# Patient Record
Sex: Female | Born: 1965 | Race: Black or African American | Hispanic: No | Marital: Single | Smoking: Current every day smoker
Health system: Southern US, Community
[De-identification: ages and names within clinical notes are randomized; demographics above are authoritative.]

## PROBLEM LIST (undated history)

## (undated) DIAGNOSIS — I10 Essential (primary) hypertension: Secondary | ICD-10-CM

## (undated) DIAGNOSIS — E7800 Pure hypercholesterolemia, unspecified: Secondary | ICD-10-CM

---

## 2016-08-13 ENCOUNTER — Encounter: Payer: Self-pay | Admitting: *Deleted

## 2016-08-13 ENCOUNTER — Ambulatory Visit: Payer: Self-pay | Admitting: Internal Medicine

## 2017-01-28 ENCOUNTER — Emergency Department (HOSPITAL_BASED_OUTPATIENT_CLINIC_OR_DEPARTMENT_OTHER)
Admission: EM | Admit: 2017-01-28 | Discharge: 2017-01-28 | Disposition: A | Discharge status: Home / Self Care | Payer: Medicare Other | Attending: Emergency Medicine | Admitting: Emergency Medicine

## 2017-01-28 ENCOUNTER — Encounter (HOSPITAL_BASED_OUTPATIENT_CLINIC_OR_DEPARTMENT_OTHER): Payer: Self-pay | Admitting: *Deleted

## 2017-01-28 ENCOUNTER — Emergency Department (HOSPITAL_BASED_OUTPATIENT_CLINIC_OR_DEPARTMENT_OTHER): Payer: Medicare Other

## 2017-01-28 DIAGNOSIS — Z79899 Other long term (current) drug therapy: Secondary | ICD-10-CM | POA: Insufficient documentation

## 2017-01-28 DIAGNOSIS — M79644 Pain in right finger(s): Secondary | ICD-10-CM | POA: Diagnosis not present

## 2017-01-28 DIAGNOSIS — I10 Essential (primary) hypertension: Secondary | ICD-10-CM | POA: Insufficient documentation

## 2017-01-28 DIAGNOSIS — Z7982 Long term (current) use of aspirin: Secondary | ICD-10-CM | POA: Diagnosis not present

## 2017-01-28 DIAGNOSIS — F17210 Nicotine dependence, cigarettes, uncomplicated: Secondary | ICD-10-CM | POA: Insufficient documentation

## 2017-01-28 HISTORY — DX: Essential (primary) hypertension: I10

## 2017-01-28 HISTORY — DX: Pure hypercholesterolemia, unspecified: E78.00

## 2017-01-28 MED ORDER — IBUPROFEN 400 MG PO TABS: 400.0000 mg | ORAL_TABLET | Freq: Four times a day (QID) | ORAL | 0 refills | Status: AC | PRN

## 2017-01-28 MED FILL — IBUPROFEN 400 MG TABLET: 400 | 8 days supply | Qty: 30 | Fill #0

## 2017-01-28 NOTE — ED Notes (Signed)
ED Provider at bedside. 

## 2017-01-28 NOTE — ED Triage Notes (Signed)
Pain, redness, swelling to her right 2nd digit x 4 days.

## 2017-01-28 NOTE — Discharge Instructions (Signed)
Take the medications as needed for pain. Follow-up with an orthopedic doctor if her symptoms are not improving over the next couple of weeks. Use the splint for comfort

## 2017-01-28 NOTE — ED Provider Notes (Signed)
[facility]   CSN: [id number] Arrival date & time: 01/28/17  1142     History   Chief Complaint Chief Complaint  Patient presents with  . Hand Pain    HPI Carrie Hale is a 51 y.o. female.  HPI Patient presents to the emergency room with complaints of right index finger pain. Patient has noticed some pain and swelling at the PIP joint on Her right finger. Patient states whenever she knocks it's always with that finger. She is noticed increased swelling and tenderness recently. She denies any fevers. No known injuries. Past Medical History:  Diagnosis Date  . High cholesterol   . Hypertension     There are no active problems to display for this patient.   History reviewed. No pertinent surgical history.  OB History    No data available       Home Medications    Prior to Admission medications   Medication Sig Start Date End Date Taking? Authorizing Provider  aspirin 81 MG tablet Take 81 mg by mouth daily.   Yes [provider]  ATENOLOL PO Take by mouth.   Yes [provider]  LISINOPRIL PO Take by mouth.   Yes [provider]  SIMVASTATIN PO Take by mouth.   Yes [provider]  ibuprofen (ADVIL,MOTRIN) 400 MG tablet Take 1 tablet (400 mg total) by mouth every 6 (six) hours as needed. 01/28/17   Linwood DibblesKnapp, Denym Rahimi, MD    Family History No family history on file.  Social History Social History  Substance Use Topics  . Smoking status: Current Every Day Smoker  . Smokeless tobacco: Never Used  . Alcohol use Yes     Allergies   Patient has no known allergies.   Review of Systems Review of Systems  All other systems reviewed and are negative.    Physical Exam Updated Vital Signs BP 134/78   Pulse 63   Temp 99 F (37.2 C) (Oral)   Resp 20   Ht 1.549 m (5\' 1" )   Wt 59.9 kg (132 lb)   SpO2 97%   BMI 24.94 kg/m   Physical Exam  Constitutional: She appears well-developed and well-nourished.  No distress.  HENT:  Head: Normocephalic and atraumatic.  Right Ear: External ear normal.  Left Ear: External ear normal.  Eyes: Conjunctivae are normal. Right eye exhibits no discharge. Left eye exhibits no discharge. No scleral icterus.  Neck: Neck supple. No tracheal deviation present.  Cardiovascular: Normal rate.   Pulmonary/Chest: Effort normal. No stridor. No respiratory distress.  Abdominal: She exhibits no distension.  Musculoskeletal: She exhibits no edema.       Right hand: She exhibits tenderness and bony tenderness. She exhibits no deformity.       Hands: ttp pip joint right index finger, increased callus formation, mild erythema, no fluctuance, no pustule , no bony tenderness, distal capillary refill is normal, no erythema or edema of the finger  Neurological: She is alert. Cranial nerve deficit: no gross deficits.  Skin: Skin is warm and dry. No rash noted.  Psychiatric: She has a normal mood and affect.  Nursing note and vitals reviewed.    ED Treatments / Results  Labs (all labs ordered are listed, but only abnormal results are displayed) Labs Reviewed - No data to display   Radiology Dg Finger Index Right  Result Date: 01/28/2017 CLINICAL DATA:  Right index finger is swollen from knocking on doors. EXAM: RIGHT INDEX FINGER 2+V COMPARISON:  None. FINDINGS: Tiny ossicle adjacent to the medial aspect of the PIP joint of the index finger may resent a tiny accessory ossicle versus sequela of age-indeterminate avulsive injury. No additional fractures identified. Minimal soft tissue swelling about the PIP joint of the index finger. No radiopaque foreign body. Joint spaces are preserved. No erosions. IMPRESSION: Tiny ossicle adjacent to the PIP joint of the index finger may represent an accessory ossicle versus the sequela of age-indeterminate avulsive injury. Correlation for point tenderness at this location is recommended. Electronically Signed   By: Simonne Come M.D.   On:  01/28/2017 13:45    Procedures Procedures (including critical care time)  Medications Ordered in ED Medications - No data to display   Initial Impression / Assessment and Plan / ED Course  I have reviewed the triage vital signs and the nursing notes.  Pertinent labs & imaging results that were available during my care of the patient were reviewed by me and considered in my medical decision making (see chart for details).   the patient's x-rays show tiny ossicles adjacent to the PIP joint. This possible she's had a small avulsion injury. Does have focal tenderness in that area. Patient will be placed in a foam finger splint for comfort. Follow-up with orthopedic hand surgery for symptoms do not resolve  Final Clinical Impressions(s) / ED Diagnoses   Final diagnoses:  Pain of finger of right hand    New Prescriptions New Prescriptions   IBUPROFEN (ADVIL,MOTRIN) 400 MG TABLET    Take 1 tablet (400 mg total) by mouth every 6 (six) hours as needed.     Linwood Dibbles, MD 01/28/17 [phone]

## 2018-04-26 IMAGING — DX DG FINGER INDEX 2+V*R*
3 series · 3 of 3 positions shown · non-contrast
Comparison: None.

CLINICAL DATA: Right index finger is swollen from knocking on
doors.

EXAM:
RIGHT INDEX FINGER 2+V

[finger ap]
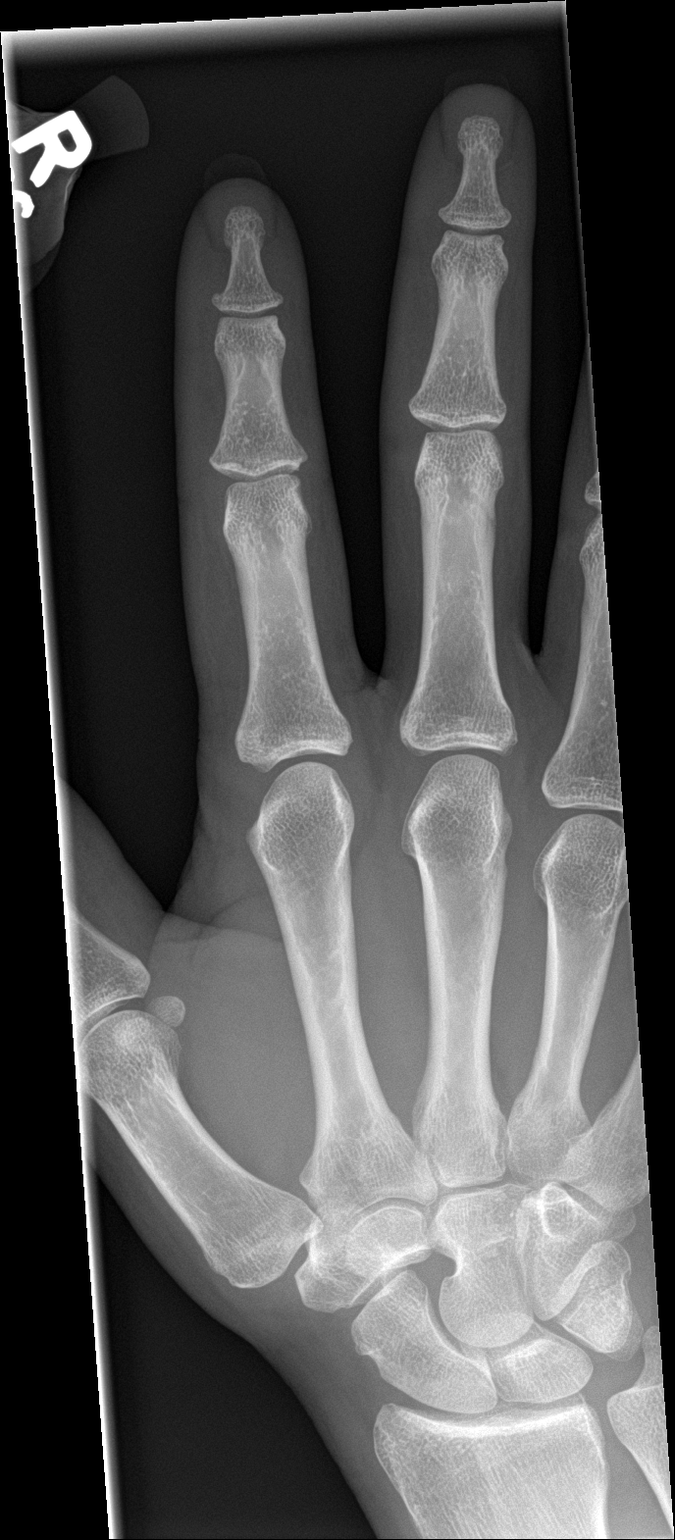

[finger obl]
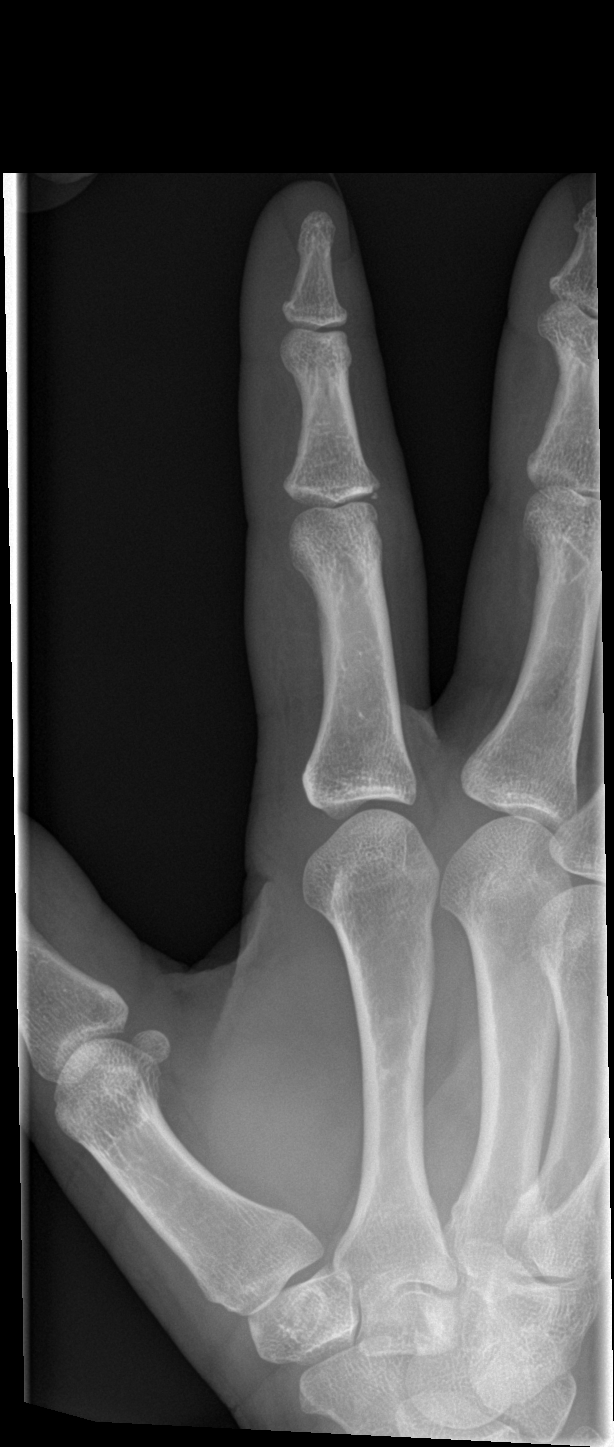

[finger lat]
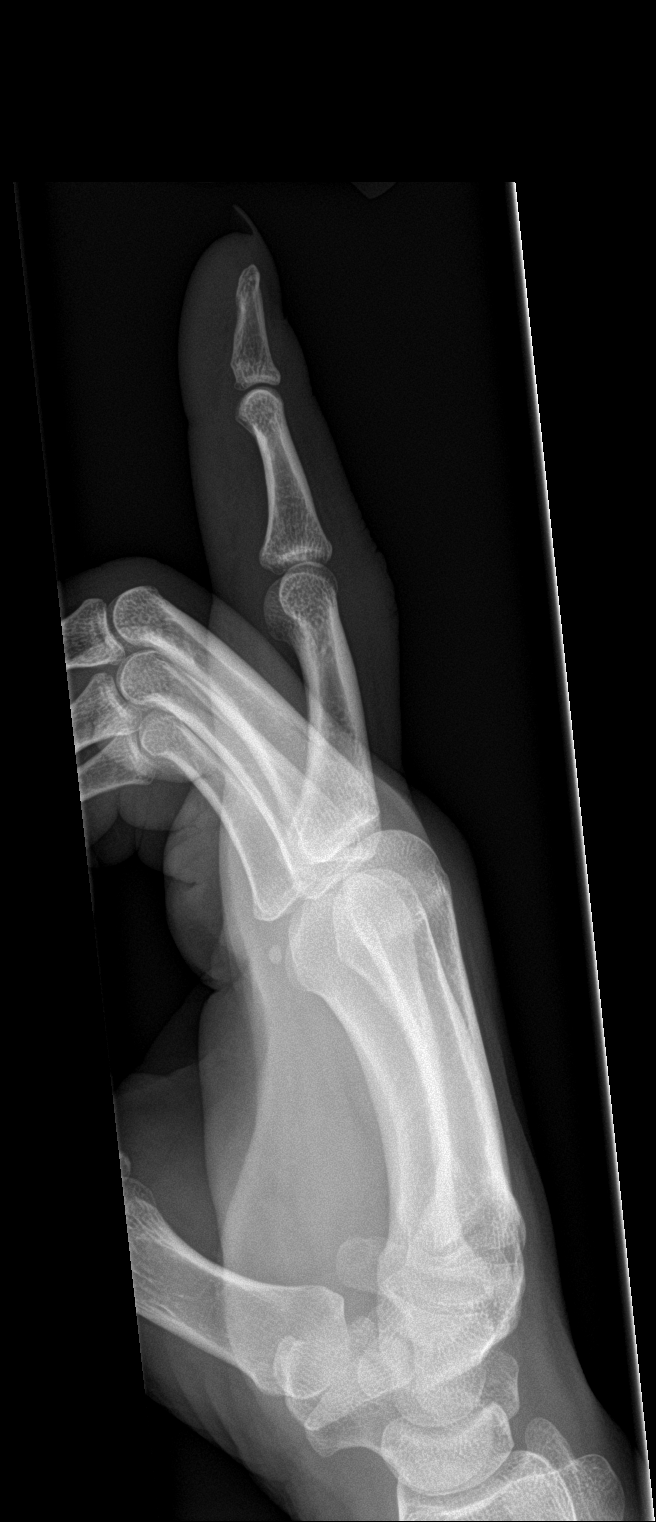

[3 of 3 positions shown; findings below may reference images not displayed]

FINDINGS: Tiny ossicle adjacent to the medial aspect of the PIP joint of the
index finger may resent a tiny accessory ossicle versus sequela of
age-indeterminate avulsive injury. No additional fractures
identified. Minimal soft tissue swelling about the PIP joint of the
index finger. No radiopaque foreign body. Joint spaces are
preserved. No erosions.
IMPRESSION: Tiny ossicle adjacent to the PIP joint of the index finger may
represent an accessory ossicle versus the sequela of
age-indeterminate avulsive injury. Correlation for point tenderness
at this location is recommended.

## 2024-08-02 DEATH — deceased
